# Patient Record
Sex: Female | Born: 2000 | Race: White | Hispanic: No | Marital: Single | Smoking: Current every day smoker
Health system: Southern US, Community
[De-identification: ages and names within clinical notes are randomized; demographics above are authoritative.]

## PROBLEM LIST (undated history)

## (undated) DIAGNOSIS — F419 Anxiety disorder, unspecified: Secondary | ICD-10-CM

## (undated) DIAGNOSIS — F32A Depression, unspecified: Secondary | ICD-10-CM

## (undated) HISTORY — PX: WISDOM TOOTH EXTRACTION: SHX21

## (undated) HISTORY — DX: Depression, unspecified: F32.A

## (undated) HISTORY — DX: Anxiety disorder, unspecified: F41.9

---
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
## 2018-05-25 ENCOUNTER — Encounter: Payer: Self-pay | Admitting: Family Medicine

## 2018-05-25 ENCOUNTER — Encounter (HOSPITAL_COMMUNITY): Payer: Self-pay | Admitting: Emergency Medicine

## 2018-05-25 ENCOUNTER — Emergency Department (HOSPITAL_COMMUNITY)
Admission: EM | Admit: 2018-05-25 | Discharge: 2018-05-25 | Disposition: A | Discharge status: Home / Self Care | Payer: Self-pay | Attending: Emergency Medicine | Admitting: Emergency Medicine

## 2018-05-25 ENCOUNTER — Other Ambulatory Visit: Payer: Self-pay

## 2018-05-25 ENCOUNTER — Ambulatory Visit: Payer: Self-pay | Admitting: Family Medicine

## 2018-05-25 VITALS — BP 138/88 | HR 114 | Temp 98.9°F | Resp 20 | Ht 66.0 in | Wt 144.6 lb

## 2018-05-25 DIAGNOSIS — J029 Acute pharyngitis, unspecified: Secondary | ICD-10-CM

## 2018-05-25 DIAGNOSIS — R1310 Dysphagia, unspecified: Secondary | ICD-10-CM

## 2018-05-25 DIAGNOSIS — J351 Hypertrophy of tonsils: Secondary | ICD-10-CM

## 2018-05-25 DIAGNOSIS — J020 Streptococcal pharyngitis: Secondary | ICD-10-CM | POA: Insufficient documentation

## 2018-05-25 DIAGNOSIS — R112 Nausea with vomiting, unspecified: Secondary | ICD-10-CM | POA: Insufficient documentation

## 2018-05-25 DIAGNOSIS — R509 Fever, unspecified: Secondary | ICD-10-CM

## 2018-05-25 LAB — BASIC METABOLIC PANEL
ANION GAP: 11 (ref 5–15)
BUN: 8 mg/dL (ref 6–20)
CALCIUM: 9 mg/dL (ref 8.9–10.3)
CO2: 22 mmol/L (ref 22–32)
CREATININE: 0.6 mg/dL (ref 0.44–1.00)
Chloride: 105 mmol/L (ref 98–111)
GFR calc Af Amer: >60 mL/min (ref >60)
Glucose, Bld: 95 mg/dL (ref 70–99)
Potassium: 3.9 mmol/L (ref 3.5–5.1)
Sodium: 138 mmol/L (ref 135–145)

## 2018-05-25 LAB — CBC WITH DIFFERENTIAL/PLATELET
BASOS PCT: 0 %
Basophils Absolute: 0 10*3/uL (ref 0.0–0.1)
EOS ABS: 0 10*3/uL (ref 0.0–0.5)
EOS PCT: 0 %
HEMATOCRIT: 42.3 % (ref 36.0–46.0)
Hemoglobin: 13.7 g/dL (ref 12.0–15.0)
LYMPHS ABS: 2.7 10*3/uL (ref 0.7–4.0)
Lymphocytes Relative: 27 %
MCH: 28.7 pg (ref 26.0–34.0)
MCHC: 32.4 g/dL (ref 30.0–36.0)
MCV: 88.7 fL (ref 80.0–100.0)
Monocytes Absolute: 0.6 10*3/uL (ref 0.1–1.0)
Monocytes Relative: 6 %
NEUTROS PCT: 67 %
NRBC: 0 % (ref 0.0–0.2)
Neutro Abs: 6.8 10*3/uL (ref 1.7–7.7)
PLATELETS: 185 10*3/uL (ref 150–400)
RBC: 4.77 MIL/uL (ref 3.87–5.11)
RDW: 12.6 % (ref 11.5–15.5)
WBC MORPHOLOGY: ABNORMAL
WBC: 10.1 10*3/uL (ref 4.0–10.5)
nRBC: 0 /100 WBC

## 2018-05-25 LAB — I-STAT BETA HCG BLOOD, ED (MC, WL, AP ONLY): I-stat hCG, quantitative: <5 m[IU]/mL (ref <5)

## 2018-05-25 LAB — POCT RAPID STREP A (OFFICE): Rapid Strep A Screen: POSITIVE — AB

## 2018-05-25 MED ORDER — SODIUM CHLORIDE 0.9 % IV SOLN: 1000.0000 mL | INTRAVENOUS | Status: DC

## 2018-05-25 MED ORDER — KETOROLAC TROMETHAMINE 30 MG/ML IJ SOLN
30.0000 mg | Freq: Once | INTRAMUSCULAR | Status: AC
  Administered 2018-05-25: 30 mg via INTRAVENOUS
  Filled 2018-05-25: qty 1

## 2018-05-25 MED ORDER — CLINDAMYCIN HCL 300 MG PO CAPS: 300.0000 mg | ORAL_CAPSULE | Freq: Three times a day (TID) | ORAL | 0 refills | Status: DC

## 2018-05-25 MED ORDER — HYDROCODONE-ACETAMINOPHEN 7.5-325 MG/15ML PO SOLN: 15.0000 mL | Freq: Four times a day (QID) | ORAL | 0 refills | Status: AC | PRN

## 2018-05-25 MED ORDER — DEXAMETHASONE SODIUM PHOSPHATE 10 MG/ML IJ SOLN
10.0000 mg | Freq: Once | INTRAMUSCULAR | Status: AC
Start: 2018-05-25 — End: 2018-05-25
  Administered 2018-05-25: 10 mg via INTRAVENOUS
  Filled 2018-05-25: qty 1

## 2018-05-25 MED ORDER — SODIUM CHLORIDE 0.9 % IV BOLUS (SEPSIS)
1000.0000 mL | Freq: Once | INTRAVENOUS | Status: AC
  Administered 2018-05-25: 1000 mL via INTRAVENOUS

## 2018-05-25 MED ORDER — CLINDAMYCIN PHOSPHATE 600 MG/50ML IV SOLN
600.0000 mg | Freq: Once | INTRAVENOUS | Status: AC
Start: 2018-05-25 — End: 2018-05-25
  Administered 2018-05-25: 600 mg via INTRAVENOUS
  Filled 2018-05-25: qty 50

## 2018-05-25 NOTE — Discharge Instructions (Addendum)
Salt water gargles multiple times a day. Ibuprofen for pain. Lortab for severe pain, do not drive if taking. Clindamycin as prescribed until all gone. Follow up as needed. Return if unable to keep down medicaitons, vomiting, cannot drink fluids.

## 2018-05-25 NOTE — ED Provider Notes (Signed)
[facility] EMERGENCY DEPARTMENT Provider Note   CSN: [id number] Arrival date & time: 05/25/18  1102     History   Chief Complaint Chief Complaint  Patient presents with  . Sore Throat    HPI Marissa Fox is a 17 y.o. female.  HPI Marissa Fox is a 17 y.o. female presents to emergency department complaining of sore throat.  Patient has had sore throat for about a week with some nasal congestion and cough.  She states symptoms however significantly worsened 2 days ago.  She states that her tonsils swelled up and made it difficult for her to swallow any solids.  She also reports associated nausea and vomiting.  She states she really has not been able to keep any solids down even when she tries to swallow them.  She has been sipping on water but states she is not drinking as much as she should because it is painful.  She denies any fever.  She states that she went to an urgent care today and was sent here for further evaluation and treatment.  Patient states that both of her tonsils are painful, however left one is more painful than the right.  She has been taking Mucinex, ibuprofen, NyQuil with no relief of her symptoms.  She was not given any antibiotics or treatment prior to coming in.  She did test positive for strep throat at urgent care today.  History reviewed. No pertinent past medical history.  There are no active problems to display for this patient.   Past Surgical History:  Procedure Laterality Date  . WISDOM TOOTH EXTRACTION       OB History   None      Home Medications    Prior to Admission medications   Not on File    Family History No family history on file.  Social History Social History   Tobacco Use  . Smoking status: Never Smoker  . Smokeless tobacco: Never Used  Substance Use Topics  . Alcohol use: No  . Drug use: Not on file     Allergies   Patient has no known allergies.   Review of Systems Review of Systems    Constitutional: Negative for chills and fever.  HENT: Positive for congestion, sore throat and trouble swallowing.   Respiratory: Negative for cough, chest tightness and shortness of breath.   Cardiovascular: Negative for chest pain, palpitations and leg swelling.  Gastrointestinal: Positive for nausea and vomiting. Negative for abdominal pain and diarrhea.  Musculoskeletal: Negative for arthralgias, myalgias, neck pain and neck stiffness.  Skin: Negative for rash.  Neurological: Negative for dizziness, weakness and headaches.  All other systems reviewed and are negative.    Physical Exam Updated Vital Signs BP 133/82 (BP Location: Right Arm)   Pulse (!) 101   Temp 98.3 F (36.8 C) (Oral)   Resp 17   Ht 5\' 6"  (1.676 m)   Wt 65.3 kg   SpO2 99%   BMI 23.24 kg/m   Physical Exam  Constitutional: She appears well-developed and well-nourished. No distress.  HENT:  Head: Normocephalic.  Right Ear: Tympanic membrane, external ear and ear canal normal.  Left Ear: Tympanic membrane, external ear and ear canal normal.  Nose: Nose normal.  Mouth/Throat: Uvula is midline. Mucous membranes are dry. Posterior oropharyngeal erythema present. No oropharyngeal exudate or tonsillar abscesses. Tonsils are 3+ on the right. Tonsils are 4+ on the left. Tonsillar exudate.  Eyes: Conjunctivae are normal.  Neck: Neck  supple.  Cardiovascular: Normal rate, regular rhythm and normal heart sounds.  Pulmonary/Chest: Effort normal and breath sounds normal. No respiratory distress. She has no wheezes. She has no rales.  Abdominal: Soft. Bowel sounds are normal. She exhibits no distension. There is no tenderness. There is no rebound.  Musculoskeletal: She exhibits no edema.  Neurological: She is alert.  Skin: Skin is warm and dry.  Psychiatric: She has a normal mood and affect. Her behavior is normal.  Nursing note and vitals reviewed.    ED Treatments / Results  Labs (all labs ordered are listed,  but only abnormal results are displayed) Labs Reviewed  CBC WITH DIFFERENTIAL/PLATELET  BASIC METABOLIC PANEL  I-STAT BETA HCG BLOOD, ED ([facility], [facility], [facility] ONLY)    EKG None  Radiology No results found.  Procedures Procedures (including critical care time)  Medications Ordered in ED Medications  sodium chloride 0.9 % bolus 1,000 mL (has no administration in time range)    Followed by  sodium chloride 0.9 % bolus 1,000 mL (has no administration in time range)    Followed by  0.9 %  sodium chloride infusion (has no administration in time range)  clindamycin (CLEOCIN) IVPB 600 mg (has no administration in time range)  dexamethasone (DECADRON) injection 10 mg (has no administration in time range)  ketorolac (TORADOL) 30 MG/ML injection 30 mg (has no administration in time range)     Initial Impression / Assessment and Plan / ED Course  I have reviewed the triage vital signs and the nursing notes.  Pertinent labs & imaging results that were available during my care of the patient were reviewed by me and considered in my medical decision making (see chart for details).     Patient with bilateral tonsillitis with exudate, difficulty swallowing solids, but able to swallow liquids.  Positive rapid strep at urgent care today.  On exam, tonsils are enlarged bilaterally with exudate, left tonsil slightly larger than right.  Anterior cervical lymphadenopathy present.  Patient's oral mucosa is dry.  She is mildly tachycardic, heart rate is 101.  Based on the exam, no concern for peritonsillar or retropharyngeal abscess.  Her uvula is midline.  She is able to speak and she is tolerating swallowing her own secretions.  I will get basic labs on her, will administer IV fluids and steroids as well as Toradol and give her a dose of IV antibiotics.  I will reassess her after that.  1:23 PM Labs all within normal. Normal WBC. Normal electrolytes.  Patient feels better after fluids and medications.  She is  able to drink and has been drinking Sprite while in emergency department.  Tolerating it well.  Patient is stable for discharge home.  I will prescribe her antibiotics, instructed to take until all gone.  I will also provide her with some pain medications.  The patient is instructed to come back if she is unable to keep down liquids, unable to swallow, or any new concerning symptoms.  Patient agreed.  Vitals:   05/25/18 1118 05/25/18 1130 05/25/18 1200 05/25/18 1230  BP:  120/80 115/66 119/75  Pulse:  93 93 93  Resp:      Temp:      TempSrc:      SpO2:  98% 100% 99%  Weight: 65.3 kg     Height:         Final Clinical Impressions(s) / ED Diagnoses   Final diagnoses:  Strep pharyngitis    ED Discharge Orders  None       Jaynie CrumbleKirichenko, Lugene Hitt, Cordelia Poche-C 05/25/18 1510    Mesner, Barbara CowerJason, MD 05/26/18 1056

## 2018-05-25 NOTE — ED Triage Notes (Signed)
Pt in from home with c/o swollen tonsils and throat soreness. States she just left UC and was dx with strep throat. States sore throat began over weekend and tonsil swelling began 2 days ago. Able to speak in full sentences, sats 98% and able to swallow fluids

## 2018-05-25 NOTE — Patient Instructions (Signed)
Given the severity of your swollen tonsils and difficulty swallowing, I am deferring you to the Emergency Department at [facility]  [address]  [place] [address]    Pharyngitis Pharyngitis is a sore throat (pharynx). There is redness, pain, and swelling of your throat. Follow these instructions at home:  Drink enough fluids to keep your pee (urine) clear or pale yellow.  Only take medicine as told by your doctor. ? You may get sick again if you do not take medicine as told. Finish your medicines, even if you start to feel better. ? Do not take aspirin.  Rest.  Rinse your mouth (gargle) with salt water ( tsp of salt per 1 qt of water) every 1-2 hours. This will help the pain.  If you are not at risk for choking, you can suck on hard candy or sore throat lozenges. Contact a doctor if:  You have large, tender lumps on your neck.  You have a rash.  You cough up green, yellow-brown, or bloody spit. Get help right away if:  You have a stiff neck.  You drool or cannot swallow liquids.  You throw up (vomit) or are not able to keep medicine or liquids down.  You have very bad pain that does not go away with medicine.  You have problems breathing (not from a stuffy nose). This information is not intended to replace advice given to you by your health care provider. Make sure you discuss any questions you have with your health care provider. Document Released: 12/01/2007 Document Revised: 11/20/2015 Document Reviewed: 02/19/2013 Elsevier Interactive Patient Education  2017 [employer]

## 2018-05-25 NOTE — Progress Notes (Signed)
Patient ID: Marissa Fox, female    DOB: 11/04/2000, 17 y.o.   MRN: [id number]  PCP: Estrella Myrtle, MD  Chief Complaint  Patient presents with  . Sore Throat    fever,ear pain-bilaterl;sinus press; cough - drk yellow; HA x 5 dys    Subjective:  HPI Marissa Fox is a 17 y.o. female presents for evaluation 3 to 4 days of sore throat.  She has been unable to intake fluids or eat anything over the last 3 days.  Her throat has grown increasingly sore, with white patches, and the swelling has worsened to the point that is difficult to swallow any foods.  She attempted to eat a small piece of bread and had dysphasia to the point that she had to vomit to remove the obstruction.  She also have some associated URI symptoms.  She is currently afebrile but has been taking over-the-counter medications that contain acetaminophen.  She is having some mild difficulty moving air giving her tonsils are obstructing her airway.  He is not in any distress.  She also has very prominent enlarged lymph nodes on side of her neck which has increased in diameter over the course of the last 3 days. Social History   Socioeconomic History  . Marital status: Single    Spouse name: Not on file  . Number of children: Not on file  . Years of education: Not on file  . Highest education level: Not on file  Occupational History  . Not on file  Social Needs  . Financial resource strain: Not on file  . Food insecurity:    Worry: Not on file    Inability: Not on file  . Transportation needs:    Medical: Not on file    Non-medical: Not on file  Tobacco Use  . Smoking status: Never Smoker  . Smokeless tobacco: Never Used  Substance and Sexual Activity  . Alcohol use: No  . Drug use: Not on file  . Sexual activity: Not on file  Lifestyle  . Physical activity:    Days per week: Not on file    Minutes per session: Not on file  . Stress: Not on file  Relationships  . Social connections:    Talks on phone: Not on  file    Gets together: Not on file    Attends religious service: Not on file    Active member of club or organization: Not on file    Attends meetings of clubs or organizations: Not on file    Relationship status: Not on file  . Intimate partner violence:    Fear of current or ex partner: Not on file    Emotionally abused: Not on file    Physically abused: Not on file    Forced sexual activity: Not on file  Other Topics Concern  . Not on file  Social History Narrative  . Not on file    No family history on file.   Review of Systems  Pertinent negatives listed in HPI There are no active problems to display for this patient.   No Known Allergies  Prior to Admission medications   Not on File    Past Medical, Surgical Family and Social History reviewed and updated.    Objective:   Today's Vitals   05/25/18 1002  BP: 138/88  Pulse: (!) 114  Resp: 20  Temp: 98.9 F (37.2 C)  TempSrc: Oral  SpO2: 97%  Weight: 144 lb 9.6 oz (65.6  kg)  Height: 5\' 6"  (1.676 m)    Wt Readings from Last 3 Encounters:  05/25/18 144 lb 9.6 oz (65.6 kg) (76 %, Z= 0.71)*  12/26/16 151 lb 1.6 oz (68.5 kg) (85 %, Z= 1.04)*  10/28/14 167 lb 3 oz (75.8 kg) (94 %, Z= 1.57)*   * Growth percentiles are based on CDC (Girls, 2-20 Years) data.     Physical Exam  HENT:  Head: Normocephalic and atraumatic.  Mouth/Throat: Uvula swelling present. Oropharyngeal exudate, posterior oropharyngeal edema and posterior oropharyngeal erythema present. Tonsils are 3+ on the right. Tonsils are 4+ on the left. Tonsillar exudate.  Neck:    Cardiovascular: Regular rhythm. Tachycardia present.  Pulmonary/Chest: Effort normal and breath sounds normal.                  Assessment & Plan:  1. Sore throat 2. Fever, unspecified fever cause 3. Swelling of tonsil 4. Dysphagia, unspecified type  I am referring patient over to [facility].  Patient has gross swelling of bilateral  tonsils with the left dorsally obstructing her airway.  Is severely tachycardic has a bounding pulse has been unable to intake fluids at least for the last 8 hours.  To dysphasic episode with food being stuck in her throat.  Her friends who accompany her to today's visit notes today is the worsening of the appearance of her throat.  He has grossly enlarged lymph node on the left cervical region which is tender to palpation.  Return to ER as I am concerned for patient having an obstructed airway. She likely will require  treatment with fluids, IV or IM antibiotics given her inability to swallow felt it would be inappropriate to prescribe oral capsules at present. Friends are transporting patient to ER.    If symptoms worsen or do not improve, return for follow-up, follow-up with PCP, or at the emergency department if severity of symptoms warrant a higher level of care.   Godfrey PickKimberly S. Tiburcio PeaHarris, MSN, FNP-C [address]  [place], [place] [address] [phone]

## 2020-02-20 ENCOUNTER — Ambulatory Visit
Admission: EM | Admit: 2020-02-20 | Discharge: 2020-02-20 | Disposition: A | Discharge status: Home / Self Care | Payer: Self-pay | Attending: Physician Assistant | Admitting: Physician Assistant

## 2020-02-20 ENCOUNTER — Other Ambulatory Visit: Payer: Self-pay

## 2020-02-20 DIAGNOSIS — N939 Abnormal uterine and vaginal bleeding, unspecified: Secondary | ICD-10-CM

## 2020-02-20 LAB — POCT URINALYSIS DIP (MANUAL ENTRY)
Bilirubin, UA: NEGATIVE
Glucose, UA: NEGATIVE mg/dL
Ketones, POC UA: NEGATIVE mg/dL
Leukocytes, UA: NEGATIVE
Nitrite, UA: NEGATIVE
Protein Ur, POC: NEGATIVE mg/dL
Spec Grav, UA: 1.015 (ref 1.010–1.025)
Urobilinogen, UA: 0.2 E.U./dL
pH, UA: 7 (ref 5.0–8.0)

## 2020-02-20 LAB — POCT URINE PREGNANCY: Preg Test, Ur: NEGATIVE

## 2020-02-20 MED ORDER — MEGESTROL ACETATE 40 MG PO TABS: 40.0000 mg | ORAL_TABLET | Freq: Two times a day (BID) | ORAL | 0 refills | Status: DC

## 2020-02-20 NOTE — ED Provider Notes (Signed)
[facility]    CSN: [id number] Arrival date & time: 02/20/20  1300      History   Chief Complaint Chief Complaint  Patient presents with  . Vaginal Bleeding    HPI Marissa Fox is a 19 y.o. female.   19 year old female comes in for AUB. States last cycle started 01/29/2020, bleeding till 02/14/2020. Started bleeding again yesterday with light flow. Today, woke up with heavy flow and bleeding 1 pad/hour. Also starting yesterday, had suprapubic pressure. Denies nausea/vomiting. Low back pain after bending. Denies fever. Denies urinary symptoms. Denies vaginal discharge. Sexually active with one female partner, consistent condom use.  Patient states normal cycle 6-7 days long.  Usually uses about 5-6 pads a day.  She had a normal cycle 12/2019.   States history of PID from untreated UTI.      History reviewed. No pertinent past medical history.  There are no problems to display for this patient.   Past Surgical History:  Procedure Laterality Date  . WISDOM TOOTH EXTRACTION      OB History   No obstetric history on file.      Home Medications    Prior to Admission medications   Medication Sig Start Date End Date Taking? Authorizing Provider  ibuprofen (ADVIL,MOTRIN) 200 MG tablet Take 200 mg by mouth every 6 (six) hours as needed for moderate pain.   Yes [provider]  acetaminophen (TYLENOL) 500 MG tablet Take 500 mg by mouth every 6 (six) hours as needed for fever.    [provider]  megestrol (MEGACE) 40 MG tablet Take 1 tablet (40 mg total) by mouth 2 (two) times daily. 02/20/20   Belinda Fisher, PA-C    Family History Family History  Problem Relation Age of Onset  . Healthy Mother   . Healthy Father     Social History Social History   Tobacco Use  . Smoking status: Never Smoker  . Smokeless tobacco: Never Used  Vaping Use  . Vaping Use: Every day  Substance Use Topics  . Alcohol use: No  . Drug use: Not on file      Allergies   Patient has no known allergies.   Review of Systems Review of Systems  Reason unable to perform ROS: See HPI as above.     Physical Exam Triage Vital Signs ED Triage Vitals  Enc Vitals Group     BP 02/20/20 1449 120/83     Pulse Rate 02/20/20 1449 76     Resp 02/20/20 1449 16     Temp 02/20/20 1449 98.1 F (36.7 C)     Temp Source 02/20/20 1449 Oral     SpO2 02/20/20 1449 100 %     Weight --      Height --      Head Circumference --      Peak Flow --      Pain Score 02/20/20 1528 5     Pain Loc --      Pain Edu? --      Excl. in GC? --    No data found.  Updated Vital Signs BP 120/83 (BP Location: Left Arm)   Pulse 76   Temp 98.1 F (36.7 C) (Oral)   Resp 16   LMP 01/29/2020   SpO2 100%    Physical Exam Constitutional:      General: She is not in acute distress.    Appearance: She is well-developed. She is not ill-appearing, toxic-appearing  or diaphoretic.  HENT:     Head: Normocephalic and atraumatic.  Eyes:     Conjunctiva/sclera: Conjunctivae normal.     Pupils: Pupils are equal, round, and reactive to light.  Cardiovascular:     Rate and Rhythm: Normal rate and regular rhythm.  Pulmonary:     Effort: Pulmonary effort is normal. No respiratory distress.     Comments: LCTAB Abdominal:     General: Bowel sounds are normal.     Palpations: Abdomen is soft.     Tenderness: There is no abdominal tenderness. There is no right CVA tenderness, left CVA tenderness, guarding or rebound.  Musculoskeletal:     Cervical back: Normal range of motion and neck supple.     Comments: No spinous processes tenderness on palpation.  Bilateral lumbar tenderness to palpation.  No hip tenderness to palpation.  Full range of motion of back.  Skin:    General: Skin is warm and dry.  Neurological:     Mental Status: She is alert and oriented to person, place, and time.  Psychiatric:        Behavior: Behavior normal.        Judgment: Judgment normal.       UC Treatments / Results  Labs (all labs ordered are listed, but only abnormal results are displayed) Labs Reviewed  POCT URINALYSIS DIP (MANUAL ENTRY) - Abnormal; Notable for the following components:      Result Value   Blood, UA large (*)    All other components within normal limits  POCT URINE PREGNANCY    EKG   Radiology No results found.  Procedures Procedures (including critical care time)  Medications Ordered in UC Medications - No data to display  Initial Impression / Assessment and Plan / UC Course  I have reviewed the triage vital signs and the nursing notes.  Pertinent labs & imaging results that were available during my care of the patient were reviewed by me and considered in my medical decision making (see chart for details).    Patient afebrile, nontoxic.  Abdomen soft, +BS, nontender to palpation. No vaginal discharge. Low suspicion for PID, offered pelvic exam, for which patient declined at this time.  Urine dipstick negative.  Urine pregnancy negative.  Will treat for abnormal uterine bleeding with Megace.  Follow-up with GYN for further evaluation management needed.  Return precautions given.  Patient expresses understanding and agrees to plan.  Final Clinical Impressions(s) / UC Diagnoses   Final diagnoses:  Abnormal uterine bleeding (AUB)   ED Prescriptions    Medication Sig Dispense Auth. Provider   megestrol (MEGACE) 40 MG tablet Take 1 tablet (40 mg total) by mouth 2 (two) times daily. 20 tablet Belinda Fisher, PA-C     PDMP not reviewed this encounter.   Belinda Fisher, PA-C 02/20/20 1555

## 2020-02-20 NOTE — ED Triage Notes (Addendum)
Pt c/o irregular vaginal bleeding. Pt states her LMP began 08/03 and continued bleeding until 08/19. Pt began having vag bleeding again yesterday, light in flow, then awoke this morning with heavy flow of bright red blood with clots this morning. Also c/o lower pelvic pain R>L and back pain.   Reports burning with urination last week with post stream pressure and took AZO with improvement of symptoms.   Denies n/v/d, fever, chills, vaginal discharge. 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 Reports nexplanon is out of date since 2019.

## 2020-02-20 NOTE — Discharge Instructions (Signed)
No alarming signs on exam. Start megace as directed. Please follow up with GYN for further evaluation needed. If you experience sudden worsening of abdominal pain, vomiting, fever, go to the emergency department for further evaluation.

## 2023-11-18 ENCOUNTER — Ambulatory Visit: Payer: Self-pay | Admitting: Nurse Practitioner

## 2023-11-18 VITALS — BP 110/64 | HR 92 | Temp 98.0°F | Ht 66.0 in | Wt 148.6 lb

## 2023-11-18 DIAGNOSIS — R5383 Other fatigue: Secondary | ICD-10-CM | POA: Insufficient documentation

## 2023-11-18 DIAGNOSIS — Z3009 Encounter for other general counseling and advice on contraception: Secondary | ICD-10-CM | POA: Diagnosis not present

## 2023-11-18 DIAGNOSIS — F32A Depression, unspecified: Secondary | ICD-10-CM | POA: Diagnosis not present

## 2023-11-18 DIAGNOSIS — F419 Anxiety disorder, unspecified: Secondary | ICD-10-CM

## 2023-11-18 DIAGNOSIS — F17290 Nicotine dependence, other tobacco product, uncomplicated: Secondary | ICD-10-CM | POA: Insufficient documentation

## 2023-11-18 LAB — COMPREHENSIVE METABOLIC PANEL WITH GFR
ALT: 8 U/L (ref 0–35)
AST: 11 U/L (ref 0–37)
Albumin: 4.8 g/dL (ref 3.5–5.2)
Alkaline Phosphatase: 35 U/L — ABNORMAL LOW (ref 39–117)
BUN: 12 mg/dL (ref 6–23)
CO2: 29 meq/L (ref 19–32)
Calcium: 9.2 mg/dL (ref 8.4–10.5)
Chloride: 103 meq/L (ref 96–112)
Creatinine, Ser: 0.71 mg/dL (ref 0.40–1.20)
GFR: 118.69 mL/min (ref >60.00)
Glucose, Bld: 90 mg/dL (ref 70–99)
Potassium: 3.3 meq/L — ABNORMAL LOW (ref 3.5–5.1)
Sodium: 140 meq/L (ref 135–145)
Total Bilirubin: 0.5 mg/dL (ref 0.2–1.2)
Total Protein: 7.4 g/dL (ref 6.0–8.3)

## 2023-11-18 LAB — CBC WITH DIFFERENTIAL/PLATELET
Basophils Absolute: 0.1 10*3/uL (ref 0.0–0.1)
Basophils Relative: 0.8 % (ref 0.0–3.0)
Eosinophils Absolute: 0.2 10*3/uL (ref 0.0–0.7)
Eosinophils Relative: 2.5 % (ref 0.0–5.0)
HCT: 41.1 % (ref 36.0–46.0)
Hemoglobin: 13.8 g/dL (ref 12.0–15.0)
Lymphocytes Relative: 21.8 % (ref 12.0–46.0)
Lymphs Abs: 1.5 10*3/uL (ref 0.7–4.0)
MCHC: 33.6 g/dL (ref 30.0–36.0)
MCV: 87.1 fl (ref 78.0–100.0)
Monocytes Absolute: 0.5 10*3/uL (ref 0.1–1.0)
Monocytes Relative: 7.3 % (ref 3.0–12.0)
Neutro Abs: 4.7 10*3/uL (ref 1.4–7.7)
Neutrophils Relative %: 67.6 % (ref 43.0–77.0)
Platelets: 291 10*3/uL (ref 150.0–400.0)
RBC: 4.71 Mil/uL (ref 3.87–5.11)
RDW: 12.9 % (ref 11.5–15.5)
WBC: 7 10*3/uL (ref 4.0–10.5)

## 2023-11-18 LAB — TSH: TSH: 0.52 u[IU]/mL (ref 0.35–5.50)

## 2023-11-18 LAB — VITAMIN B12: Vitamin B-12: 505 pg/mL (ref 211–911)

## 2023-11-18 LAB — VITAMIN D 25 HYDROXY (VIT D DEFICIENCY, FRACTURES): VITD: 18.27 ng/mL — ABNORMAL LOW (ref 30.00–100.00)

## 2023-11-18 NOTE — Assessment & Plan Note (Signed)
 Chronic, not controlled. Chronic anxiety is exacerbated by stress. She avoids medication due to a family history of substance abuse and is interested in therapy. Refer to a therapist for anxiety management. Recommend a magnesium supplement at bedtime (200-400 mg) for anxiety and sleep.

## 2023-11-18 NOTE — Patient Instructions (Signed)
 It was great to see you!  I have placed a referral to GYN and therapy  Try a magnesium supplement at bedtime (200-400mg ) for headaches, sleep  Try zyrtec or claritin daily for your ears   Let's follow-up in 3 months, sooner if you have concerns.  If a referral was placed today, you will be contacted for an appointment. Please note that routine referrals can sometimes take up to 3-4 weeks to process. Please call our office if you haven't heard anything after this time frame.  Take care,  Rheba Cedar, NP

## 2023-11-18 NOTE — Assessment & Plan Note (Signed)
 Nexplanon has been in place for 11 years, beyond the recommended duration. Referral to GYN for procedure discussed. Refer to GYN for Nexplanon removal and IUD placement with GYN. She prefers Dr. Donelda Fujita.

## 2023-11-18 NOTE — Assessment & Plan Note (Addendum)
 Recommend complete nicotine cessation.

## 2023-11-18 NOTE — Assessment & Plan Note (Signed)
 She suffers from chronic fatigue despite adequate sleep, possibly related to anxiety and depression. Order baseline labs to assess for underlying causes of fatigue, CMP, CBC, Vitamin D, Vitamin B12, and TSH today.

## 2023-11-18 NOTE — Progress Notes (Signed)
 New Patient Visit  BP 110/64 (BP Location: Left Arm, Patient Position: Sitting, Cuff Size: Normal)   Pulse 92   Temp 98 F (36.7 C)   Ht 5\' 6"  (1.676 m)   Wt 148 lb 9.6 oz (67.4 kg)   LMP 11/03/2023 (Exact Date)   SpO2 100%   BMI 23.98 kg/m    Subjective:    Patient ID: Marissa Fox, female    DOB: April 18, 2001, 23 y.o.   MRN: [id number]  CC: Chief Complaint  Patient presents with   Establish Care    NP. Est. Care, concerns nexplanon inplant in arm, overall check up, referral to OBGYN and a therapist    HPI: Marissa Fox is a 23 y.o. female presents for new patient visit to establish care.  Introduced to Publishing rights manager role and practice setting.  All questions answered.  Discussed provider/patient relationship and expectations.  Discussed the use of AI scribe software for clinical note transcription with the patient, who gave verbal consent to proceed.  History of Present Illness   Marissa Fox is a 23 year old female who presents for evaluation of expired Nexplanon and mental health support.  She experiences anxiety and depression with panic attacks and overwhelming anxiety two to three times a week, especially during stress. She identifies as an overthinker and has difficulty sleeping, feeling tired despite extended sleep. Headaches occur once or twice a week, attributed to stress. She has not seen a therapist or taken medication since age 54 due to lack of insurance but wants to start therapy. No shortness of breath, chest pain, stomach pain, constipation, diarrhea, rashes, changes in skin, muscle or joint pain, frequent urination, burning when urinating, dizziness, or other significant symptoms.  Her Nexplanon implant, in place since age 47, is now expired. Financial constraints and lack of insurance have prevented removal or replacement. She experiences some discomfort and wants to switch to an IUD.        11/18/2023    2:03 PM  Depression screen PHQ 2/9  Decreased  Interest 1  Down, Depressed, Hopeless 1  PHQ - 2 Score 2  Altered sleeping 2  Tired, decreased energy 1  Change in appetite 0  Feeling bad or failure about yourself  0  Trouble concentrating 0  Moving slowly or fidgety/restless 0  Suicidal thoughts 0  PHQ-9 Score 5  Difficult doing work/chores Not difficult at all      11/18/2023    2:04 PM  GAD 7 : Generalized Anxiety Score  Nervous, Anxious, on Edge 2  Control/stop worrying 2  Worry too much - different things 2  Trouble relaxing 2  Restless 0  Easily annoyed or irritable 1  Afraid - awful might happen 0  Total GAD 7 Score 9  Anxiety Difficulty Not difficult at all    Past Medical History:  Diagnosis Date   Anxiety    Depression     Past Surgical History:  Procedure Laterality Date   WISDOM TOOTH EXTRACTION      Family History  Problem Relation Age of Onset   Healthy Mother    Healthy Father    Cancer Maternal Grandmother        breast and stomach     Social History   Tobacco Use   Smoking status: Every Day    Types: E-cigarettes   Smokeless tobacco: Current   Tobacco comments:    Vapes Daily  Vaping Use   Vaping status: Every Day   Substances:  Nicotine  Substance Use Topics   Alcohol use: Not Currently    Comment: every few months 1-2 drinks   Drug use: Not Currently    Types: Marijuana    No current outpatient medications on file prior to visit.   No current facility-administered medications on file prior to visit.     Review of Systems  Constitutional:  Positive for fatigue. Negative for fever.  HENT: Negative.    Respiratory: Negative.    Cardiovascular: Negative.   Gastrointestinal: Negative.   Genitourinary: Negative.   Musculoskeletal: Negative.   Skin: Negative.   Neurological:  Positive for headaches. Negative for dizziness.  Psychiatric/Behavioral:  Positive for dysphoric mood and sleep disturbance. The patient is nervous/anxious.       Objective:     BP 110/64 (BP  Location: Left Arm, Patient Position: Sitting, Cuff Size: Normal)   Pulse 92   Temp 98 F (36.7 C)   Ht 5\' 6"  (1.676 m)   Wt 148 lb 9.6 oz (67.4 kg)   LMP 11/03/2023 (Exact Date)   SpO2 100%   BMI 23.98 kg/m   Wt Readings from Last 3 Encounters:  11/18/23 148 lb 9.6 oz (67.4 kg)  05/25/18 143 lb 15.4 oz (65.3 kg) (75%, Z= 0.69)*  05/25/18 144 lb 9.6 oz (65.6 kg) (76%, Z= 0.71)*   * Growth percentiles are based on CDC (Girls, 2-20 Years) data.    BP Readings from Last 3 Encounters:  11/18/23 110/64  02/20/20 120/83  05/25/18 119/75    Physical Exam Vitals and nursing note reviewed.  Constitutional:      General: She is not in acute distress.    Appearance: Normal appearance.  HENT:     Head: Normocephalic and atraumatic.     Right Ear: Tympanic membrane, ear canal and external ear normal.     Left Ear: Tympanic membrane, ear canal and external ear normal.  Eyes:     Conjunctiva/sclera: Conjunctivae normal.  Cardiovascular:     Rate and Rhythm: Normal rate and regular rhythm.     Pulses: Normal pulses.     Heart sounds: Normal heart sounds.  Pulmonary:     Effort: Pulmonary effort is normal.     Breath sounds: Normal breath sounds.  Abdominal:     Palpations: Abdomen is soft.     Tenderness: There is no abdominal tenderness.  Musculoskeletal:        General: Normal range of motion.     Cervical back: Normal range of motion and neck supple.     Right lower leg: No edema.     Left lower leg: No edema.  Lymphadenopathy:     Cervical: No cervical adenopathy.  Skin:    General: Skin is warm and dry.  Neurological:     General: No focal deficit present.     Mental Status: She is alert and oriented to person, place, and time.     Cranial Nerves: No cranial nerve deficit.     Coordination: Coordination normal.     Gait: Gait normal.  Psychiatric:        Mood and Affect: Mood normal.        Behavior: Behavior normal.        Thought Content: Thought content normal.         Judgment: Judgment normal.        Assessment & Plan:   Problem List Items Addressed This Visit       Other   Anxiety and depression -  Primary   Chronic, not controlled. Chronic anxiety is exacerbated by stress. She avoids medication due to a family history of substance abuse and is interested in therapy. Refer to a therapist for anxiety management. Recommend a magnesium supplement at bedtime (200-400 mg) for anxiety and sleep.      Relevant Orders   CBC with Differential/Platelet   Comprehensive metabolic panel with GFR   TSH   Ambulatory referral to Psychology   Fatigue   She suffers from chronic fatigue despite adequate sleep, possibly related to anxiety and depression. Order baseline labs to assess for underlying causes of fatigue, CMP, CBC, Vitamin D, Vitamin B12, and TSH today.       Relevant Orders   CBC with Differential/Platelet   Comprehensive metabolic panel with GFR   TSH   VITAMIN D 25 Hydroxy (Vit-D Deficiency, Fractures)   Vitamin B12   Birth control counseling   Nexplanon has been in place for 11 years, beyond the recommended duration. Referral to GYN for procedure discussed. Refer to GYN for Nexplanon removal and IUD placement with GYN. She prefers Dr. Donelda Fujita.       Relevant Orders   Ambulatory referral to Gynecology   Vaping nicotine dependence, tobacco product   Recommend complete nicotine cessation.        Follow up plan: Return in about 3 months (around 02/18/2024) for CPE.  Meldon Hanzlik A Vicy Medico

## 2023-11-22 ENCOUNTER — Ambulatory Visit: Payer: Self-pay | Admitting: Nurse Practitioner

## 2023-11-23 ENCOUNTER — Telehealth

## 2023-11-23 DIAGNOSIS — R3989 Other symptoms and signs involving the genitourinary system: Secondary | ICD-10-CM | POA: Diagnosis not present

## 2023-11-24 MED ORDER — CEPHALEXIN 500 MG PO CAPS: 500.0000 mg | ORAL_CAPSULE | Freq: Two times a day (BID) | ORAL | 0 refills | Status: DC

## 2023-11-24 MED ORDER — CEPHALEXIN 500 MG PO CAPS: 500.0000 mg | ORAL_CAPSULE | Freq: Two times a day (BID) | ORAL | 0 refills | Status: AC

## 2023-11-24 NOTE — Addendum Note (Signed)
 Addended by: Angelia Kelp on: 11/24/2023 07:35 PM   Modules accepted: Orders

## 2023-11-24 NOTE — Progress Notes (Signed)
 I have spent 5 minutes in review of e-visit questionnaire, review and updating patient chart, medical decision making and response to patient.   Piedad Climes, PA-C

## 2023-11-24 NOTE — Progress Notes (Signed)

## 2024-02-20 ENCOUNTER — Ambulatory Visit: Admitting: Nurse Practitioner

## 2024-03-12 ENCOUNTER — Encounter: Admitting: Nurse Practitioner

## 2024-03-19 ENCOUNTER — Encounter: Payer: Self-pay | Admitting: Nurse Practitioner

## 2024-03-30 ENCOUNTER — Ambulatory Visit (INDEPENDENT_AMBULATORY_CARE_PROVIDER_SITE_OTHER): Admitting: Nurse Practitioner

## 2024-03-30 ENCOUNTER — Encounter: Payer: Self-pay | Admitting: Nurse Practitioner

## 2024-03-30 VITALS — BP 128/80 | HR 98 | Temp 97.0°F | Ht 66.0 in | Wt 151.4 lb

## 2024-03-30 DIAGNOSIS — F17290 Nicotine dependence, other tobacco product, uncomplicated: Secondary | ICD-10-CM | POA: Diagnosis not present

## 2024-03-30 DIAGNOSIS — F32A Depression, unspecified: Secondary | ICD-10-CM

## 2024-03-30 DIAGNOSIS — E559 Vitamin D deficiency, unspecified: Secondary | ICD-10-CM | POA: Insufficient documentation

## 2024-03-30 DIAGNOSIS — F419 Anxiety disorder, unspecified: Secondary | ICD-10-CM | POA: Diagnosis not present

## 2024-03-30 DIAGNOSIS — Z0000 Encounter for general adult medical examination without abnormal findings: Secondary | ICD-10-CM | POA: Insufficient documentation

## 2024-03-30 LAB — CBC WITH DIFFERENTIAL/PLATELET
Absolute Lymphocytes: 1782 {cells}/uL (ref 850–3900)
Absolute Monocytes: 462 {cells}/uL (ref 200–950)
Basophils Absolute: 50 {cells}/uL (ref 0–200)
Basophils Relative: 0.7 %
Eosinophils Absolute: 128 {cells}/uL (ref 15–500)
Eosinophils Relative: 1.8 %
HCT: 41.5 % (ref 35.0–45.0)
Hemoglobin: 14 g/dL (ref 11.7–15.5)
MCH: 30.4 pg (ref 27.0–33.0)
MCHC: 33.7 g/dL (ref 32.0–36.0)
MCV: 90 fL (ref 80.0–100.0)
MPV: 9.5 fL (ref 7.5–12.5)
Monocytes Relative: 6.5 %
Neutro Abs: 4679 {cells}/uL (ref 1500–7800)
Neutrophils Relative %: 65.9 %
Platelets: 300 Thousand/uL (ref 140–400)
RBC: 4.61 Million/uL (ref 3.80–5.10)
RDW: 12 % (ref 11.0–15.0)
Total Lymphocyte: 25.1 %
WBC: 7.1 Thousand/uL (ref 3.8–10.8)

## 2024-03-30 LAB — COMPREHENSIVE METABOLIC PANEL WITH GFR
AG Ratio: 2 (calc) (ref 1.0–2.5)
ALT: 6 U/L (ref 6–29)
AST: 10 U/L (ref 10–30)
Albumin: 4.7 g/dL (ref 3.6–5.1)
Alkaline phosphatase (APISO): 37 U/L (ref 31–125)
BUN: 12 mg/dL (ref 7–25)
CO2: 27 mmol/L (ref 20–32)
Calcium: 9.5 mg/dL (ref 8.6–10.2)
Chloride: 103 mmol/L (ref 98–110)
Creat: 0.57 mg/dL (ref 0.50–0.96)
Globulin: 2.4 g/dL (ref 1.9–3.7)
Glucose, Bld: 80 mg/dL (ref 65–99)
Potassium: 3.7 mmol/L (ref 3.5–5.3)
Sodium: 138 mmol/L (ref 135–146)
Total Bilirubin: 0.7 mg/dL (ref 0.2–1.2)
Total Protein: 7.1 g/dL (ref 6.1–8.1)
eGFR: 129 mL/min/1.73m2

## 2024-03-30 LAB — VITAMIN D 25 HYDROXY (VIT D DEFICIENCY, FRACTURES): Vit D, 25-Hydroxy: 38 ng/mL (ref 30–100)

## 2024-03-30 NOTE — Assessment & Plan Note (Signed)
 Chronic, improving. Symptoms have improved slightly. She is working on establishing with a Paramedic.

## 2024-03-30 NOTE — Assessment & Plan Note (Signed)
 She is currently taking a daily supplement. Check vitamin D  levels and adjust regimen based on results.

## 2024-03-30 NOTE — Assessment & Plan Note (Signed)
 Health maintenance reviewed and updated. Discussed nutrition, exercise. Check CMP, CBC today. Follow-up 1 year.

## 2024-03-30 NOTE — Assessment & Plan Note (Signed)
 She is currently vaping daily. Recommend complete cessation.

## 2024-03-30 NOTE — Progress Notes (Signed)
 BP 128/80 (BP Location: Left Arm, Patient Position: Sitting, Cuff Size: Small)   Pulse 98   Temp (!) 97 F (36.1 C)   Ht 5' 6 (1.676 m)   Wt 151 lb 6.4 oz (68.7 kg)   LMP 03/19/2024 (Exact Date)   SpO2 100%   BMI 24.44 kg/m    Subjective:    Patient ID: Marissa Fox, female    DOB: 2000-07-12, 23 y.o.   MRN: [id number]  CC: Chief Complaint  Patient presents with   Annual Exam    No concerns, patient is fasting    HPI: Marissa Fox is a 23 y.o. female presenting on 03/30/2024 for comprehensive medical examination. Current medical complaints include:none  She currently lives with: fiance Menopausal Symptoms: no  Depression and Anxiety Screen done today and results listed below:     03/30/2024    3:02 PM 11/18/2023    2:03 PM  Depression screen PHQ 2/9  Decreased Interest 1 1  Down, Depressed, Hopeless 1 1  PHQ - 2 Score 2 2  Altered sleeping 1 2  Tired, decreased energy 1 1  Change in appetite 0 0  Feeling bad or failure about yourself  0 0  Trouble concentrating 1 0  Moving slowly or fidgety/restless 0 0  Suicidal thoughts 0 0  PHQ-9 Score 5 5  Difficult doing work/chores Somewhat difficult Not difficult at all      03/30/2024    3:03 PM 11/18/2023    2:04 PM  GAD 7 : Generalized Anxiety Score  Nervous, Anxious, on Edge 1 2  Control/stop worrying 2 2  Worry too much - different things 2 2  Trouble relaxing 1 2  Restless 2 0  Easily annoyed or irritable 1 1  Afraid - awful might happen 0 0  Total GAD 7 Score 9 9  Anxiety Difficulty Somewhat difficult Not difficult at all    The patient does not have a history of falls. I did not complete a risk assessment for falls. A plan of care for falls was not documented.   Past Medical History:  Past Medical History:  Diagnosis Date   Anxiety    Depression     Surgical History:  Past Surgical History:  Procedure Laterality Date   WISDOM TOOTH EXTRACTION      Medications:  No current outpatient  medications on file prior to visit.   No current facility-administered medications on file prior to visit.    Allergies:  No Known Allergies  Social History:  Social History   Socioeconomic History   Marital status: Single    Spouse name: Not on file   Number of children: Not on file   Years of education: Not on file   Highest education level: GED or equivalent  Occupational History   Not on file  Tobacco Use   Smoking status: Every Day    Types: E-cigarettes   Smokeless tobacco: Current   Tobacco comments:    Vapes Daily  Vaping Use   Vaping status: Every Day   Substances: Nicotine  Substance and Sexual Activity   Alcohol use: Not Currently    Comment: every few months 1-2 drinks   Drug use: Not Currently    Types: Marijuana   Sexual activity: Yes    Birth control/protection: Condom, Implant  Other Topics Concern   Not on file  Social History Narrative   Not on file   Social Drivers of Health   Financial Resource Strain: Low Risk  (  11/18/2023)   Overall Financial Resource Strain (CARDIA)    Difficulty of Paying Living Expenses: Not very hard  Food Insecurity: No Food Insecurity (11/18/2023)   Hunger Vital Sign    Worried About Running Out of Food in the Last Year: Never true    Ran Out of Food in the Last Year: Never true  Transportation Needs: No Transportation Needs (11/18/2023)   PRAPARE - Administrator, Civil Service (Medical): No    Lack of Transportation (Non-Medical): No  Physical Activity: Sufficiently Active (11/18/2023)   Exercise Vital Sign    Days of Exercise per Week: 5 days    Minutes of Exercise per Session: 90 min  Stress: Stress Concern Present (11/18/2023)   [employer] of Occupational Health - Occupational Stress Questionnaire    Feeling of Stress : Very much  Social Connections: Socially Isolated (11/18/2023)   Social Connection and Isolation Panel    Frequency of Communication with Friends and Family: More than three  times a week    Frequency of Social Gatherings with Friends and Family: Twice a week    Attends Religious Services: Never    Database administrator or Organizations: No    Attends Engineer, structural: Not on file    Marital Status: Never married  Catering manager Violence: Not on file   Social History   Tobacco Use  Smoking Status Every Day   Types: E-cigarettes  Smokeless Tobacco Current  Tobacco Comments   Vapes Daily   Social History   Substance and Sexual Activity  Alcohol Use Not Currently   Comment: every few months 1-2 drinks    Family History:  Family History  Problem Relation Age of Onset   Healthy Mother    Healthy Father    Cancer Maternal Grandmother        breast and stomach    Past medical history, surgical history, medications, allergies, family history and social history reviewed with patient today and changes made to appropriate areas of the chart.   Review of Systems  Constitutional: Negative.   HENT: Negative.    Eyes: Negative.   Respiratory: Negative.    Cardiovascular: Negative.   Gastrointestinal: Negative.   Genitourinary: Negative.   Musculoskeletal: Negative.   Skin: Negative.   Neurological: Negative.   Psychiatric/Behavioral: Negative.     All other ROS negative except what is listed above and in the HPI.      Objective:    BP 128/80 (BP Location: Left Arm, Patient Position: Sitting, Cuff Size: Small)   Pulse 98   Temp (!) 97 F (36.1 C)   Ht 5' 6 (1.676 m)   Wt 151 lb 6.4 oz (68.7 kg)   LMP 03/19/2024 (Exact Date)   SpO2 100%   BMI 24.44 kg/m   Wt Readings from Last 3 Encounters:  03/30/24 151 lb 6.4 oz (68.7 kg)  11/18/23 148 lb 9.6 oz (67.4 kg)  05/25/18 143 lb 15.4 oz (65.3 kg) (75%, Z= 0.69)*   * Growth percentiles are based on CDC (Girls, 2-20 Years) data.    Physical Exam Vitals and nursing note reviewed.  Constitutional:      General: She is not in acute distress.    Appearance: Normal appearance.   HENT:     Head: Normocephalic and atraumatic.     Right Ear: Tympanic membrane, ear canal and external ear normal.     Left Ear: Tympanic membrane, ear canal and external ear normal.  Mouth/Throat:     Mouth: Mucous membranes are moist.     Pharynx: No posterior oropharyngeal erythema.  Eyes:     Conjunctiva/sclera: Conjunctivae normal.  Cardiovascular:     Rate and Rhythm: Normal rate and regular rhythm.     Pulses: Normal pulses.     Heart sounds: Normal heart sounds.  Pulmonary:     Effort: Pulmonary effort is normal.     Breath sounds: Normal breath sounds.  Abdominal:     Palpations: Abdomen is soft.     Tenderness: There is no abdominal tenderness.  Musculoskeletal:        General: Normal range of motion.     Cervical back: Normal range of motion and neck supple.     Right lower leg: No edema.     Left lower leg: No edema.  Lymphadenopathy:     Cervical: No cervical adenopathy.  Skin:    General: Skin is warm and dry.  Neurological:     General: No focal deficit present.     Mental Status: She is alert and oriented to person, place, and time.     Cranial Nerves: No cranial nerve deficit.     Coordination: Coordination normal.     Gait: Gait normal.  Psychiatric:        Mood and Affect: Mood normal.        Behavior: Behavior normal.        Thought Content: Thought content normal.        Judgment: Judgment normal.     Results for orders placed or performed in visit on 11/18/23  CBC with Differential/Platelet   Collection Time: 11/18/23  2:34 PM  Result Value Ref Range   WBC 7.0 4.0 - 10.5 K/uL   RBC 4.71 3.87 - 5.11 Mil/uL   Hemoglobin 13.8 12.0 - 15.0 g/dL   HCT 58.8 63.9 - 53.9 %   MCV 87.1 78.0 - 100.0 fl   MCHC 33.6 30.0 - 36.0 g/dL   RDW 87.0 88.4 - 84.4 %   Platelets 291.0 150.0 - 400.0 K/uL   Neutrophils Relative % 67.6 43.0 - 77.0 %   Lymphocytes Relative 21.8 12.0 - 46.0 %   Monocytes Relative 7.3 3.0 - 12.0 %   Eosinophils Relative 2.5 0.0 -  5.0 %   Basophils Relative 0.8 0.0 - 3.0 %   Neutro Abs 4.7 1.4 - 7.7 K/uL   Lymphs Abs 1.5 0.7 - 4.0 K/uL   Monocytes Absolute 0.5 0.1 - 1.0 K/uL   Eosinophils Absolute 0.2 0.0 - 0.7 K/uL   Basophils Absolute 0.1 0.0 - 0.1 K/uL  Comprehensive metabolic panel with GFR   Collection Time: 11/18/23  2:34 PM  Result Value Ref Range   Sodium 140 135 - 145 mEq/L   Potassium 3.3 (L) 3.5 - 5.1 mEq/L   Chloride 103 96 - 112 mEq/L   CO2 29 19 - 32 mEq/L   Glucose, Bld 90 70 - 99 mg/dL   BUN 12 6 - 23 mg/dL   Creatinine, Ser 9.28 0.40 - 1.20 mg/dL   Total Bilirubin 0.5 0.2 - 1.2 mg/dL   Alkaline Phosphatase 35 (L) 39 - 117 U/L   AST 11 0 - 37 U/L   ALT 8 0 - 35 U/L   Total Protein 7.4 6.0 - 8.3 g/dL   Albumin 4.8 3.5 - 5.2 g/dL   GFR 881.30 >39.99 mL/min   Calcium 9.2 8.4 - 10.5 mg/dL  TSH   Collection Time: 11/18/23  2:34 PM  Result Value Ref Range   TSH 0.52 0.35 - 5.50 uIU/mL  VITAMIN D  25 Hydroxy (Vit-D Deficiency, Fractures)   Collection Time: 11/18/23  2:34 PM  Result Value Ref Range   VITD 18.27 (L) 30.00 - 100.00 ng/mL  Vitamin B12   Collection Time: 11/18/23  2:34 PM  Result Value Ref Range   Vitamin B-12 505 211 - 911 pg/mL      Assessment & Plan:   Problem List Items Addressed This Visit       Other   Anxiety and depression   Chronic, improving. Symptoms have improved slightly. She is working on establishing with a therapist.       Vaping nicotine dependence, tobacco product   She is currently vaping daily. Recommend complete cessation.       Routine general medical examination at a health care facility - Primary   Health maintenance reviewed and updated. Discussed nutrition, exercise. Check CMP, CBC today. Follow-up 1 year.        Relevant Orders   CBC with Differential/Platelet   Comprehensive metabolic panel with GFR   Vitamin D  deficiency   She is currently taking a daily supplement. Check vitamin D  levels and adjust regimen based on results.        Relevant Orders   VITAMIN D  25 Hydroxy (Vit-D Deficiency, Fractures)     Follow up plan: Return in about 1 year (around 03/30/2025) for CPE.   LABORATORY TESTING:  - Pap smear: scheduled with GYN  IMMUNIZATIONS:   - Tdap: Tetanus vaccination status reviewed: last tetanus booster within 10 years. - Influenza: Declined - Pneumovax: Not applicable - Prevnar: Not applicable - HPV: Up to date - Shingrix vaccine: Not applicable  SCREENING: -Mammogram: Not applicable  - Colonoscopy: Not applicable  - Bone Density: Not applicable   PATIENT COUNSELING:   Advised to take 1 mg of folate supplement per day if capable of pregnancy.   Sexuality: Discussed sexually transmitted diseases, partner selection, use of condoms, avoidance of unintended pregnancy  and contraceptive alternatives.   Advised to avoid cigarette smoking.  I discussed with the patient that most people either abstain from alcohol or drink within safe limits (<=14/week and <=4 drinks/occasion for males, <=7/weeks and <= 3 drinks/occasion for females) and that the risk for alcohol disorders and other health effects rises proportionally with the number of drinks per week and how often a drinker exceeds daily limits.  Discussed cessation/primary prevention of drug use and availability of treatment for abuse.   Diet: Encouraged to adjust caloric intake to maintain  or achieve ideal body weight, to reduce intake of dietary saturated fat and total fat, to limit sodium intake by avoiding high sodium foods and not adding table salt, and to maintain adequate dietary potassium and calcium preferably from fresh fruits, vegetables, and low-fat dairy products.    stressed the importance of regular exercise  Injury prevention: Discussed safety belts, safety helmets, smoke detector, smoking near bedding or upholstery.   Dental health: Discussed importance of regular tooth brushing, flossing, and dental visits.    NEXT PREVENTATIVE  PHYSICAL DUE IN 1 YEAR. Return in about 1 year (around 03/30/2025) for CPE.  Libia Fazzini A Tere Mcconaughey

## 2024-03-30 NOTE — Patient Instructions (Signed)
It was great to see you!  We are checking your labs today and will let you know the results via mychart/phone.   Keep up the great work!   Let's follow-up in 1 year, sooner if you have concerns.  If a referral was placed today, you will be contacted for an appointment. Please note that routine referrals can sometimes take up to 3-4 weeks to process. Please call our office if you haven't heard anything after this time frame.  Take care,  Nivin Braniff, NP  

## 2024-04-02 ENCOUNTER — Ambulatory Visit: Payer: Self-pay | Admitting: Nurse Practitioner

## 2024-04-24 DIAGNOSIS — Z130 Encounter for screening for diseases of the blood and blood-forming organs and certain disorders involving the immune mechanism: Secondary | ICD-10-CM | POA: Diagnosis not present

## 2024-04-24 DIAGNOSIS — Z30430 Encounter for insertion of intrauterine contraceptive device: Secondary | ICD-10-CM | POA: Diagnosis not present

## 2024-04-24 DIAGNOSIS — Z113 Encounter for screening for infections with a predominantly sexual mode of transmission: Secondary | ICD-10-CM | POA: Diagnosis not present

## 2024-04-24 DIAGNOSIS — Z124 Encounter for screening for malignant neoplasm of cervix: Secondary | ICD-10-CM | POA: Diagnosis not present

## 2024-04-24 DIAGNOSIS — Z3202 Encounter for pregnancy test, result negative: Secondary | ICD-10-CM | POA: Diagnosis not present

## 2024-04-24 DIAGNOSIS — Z3046 Encounter for surveillance of implantable subdermal contraceptive: Secondary | ICD-10-CM | POA: Diagnosis not present

## 2024-04-24 DIAGNOSIS — Z01419 Encounter for gynecological examination (general) (routine) without abnormal findings: Secondary | ICD-10-CM | POA: Diagnosis not present

## 2024-06-25 DIAGNOSIS — Z30431 Encounter for routine checking of intrauterine contraceptive device: Secondary | ICD-10-CM | POA: Diagnosis not present

## 2025-04-02 ENCOUNTER — Encounter: Admitting: Nurse Practitioner
